# Patient Record
Sex: Female | Born: 1980 | Race: White | Hispanic: No | Marital: Married | Smoking: Former smoker
Health system: Southern US, Community
[De-identification: ages and names within clinical notes are randomized; demographics above are authoritative.]

## PROBLEM LIST (undated history)

## (undated) DIAGNOSIS — Z789 Other specified health status: Secondary | ICD-10-CM

---
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
## 2004-09-09 ENCOUNTER — Observation Stay: Payer: Self-pay

## 2004-09-28 ENCOUNTER — Ambulatory Visit: Payer: Self-pay | Admitting: Obstetrics and Gynecology

## 2004-09-29 ENCOUNTER — Ambulatory Visit: Payer: Self-pay | Admitting: Obstetrics and Gynecology

## 2004-12-18 ENCOUNTER — Observation Stay: Payer: Self-pay | Admitting: Obstetrics and Gynecology

## 2004-12-21 ENCOUNTER — Inpatient Hospital Stay: Payer: Self-pay | Admitting: Obstetrics and Gynecology

## 2005-11-02 ENCOUNTER — Emergency Department: Payer: Self-pay | Admitting: Emergency Medicine

## 2011-09-25 ENCOUNTER — Ambulatory Visit: Payer: Self-pay

## 2011-11-07 ENCOUNTER — Emergency Department: Payer: Self-pay | Admitting: Emergency Medicine

## 2012-09-22 IMAGING — NM NM THYROID IMAGING W/ UPTAKE SINGLE (24 HR)
1 series · 3 of 3 positions shown · non-contrast
Comparison: none

REASON FOR EXAM: ongoing hyperthyroidism
COMMENTS:

[Series 1000: (id) thyroid scan · 2.40mm/px · 3 of 3 slices shown]
[im 1/3  full-range]
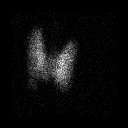
[im 2/3  full-range]
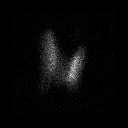
[im 3/3  full-range]
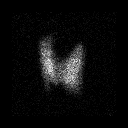

[3 of 3 positions shown; findings below may reference images not displayed]

PROCEDURE:     KNM - KNM THYROID S-S6M 24HR [DATE]  [DATE]

RESULT:     The patient has a history of hyperthyroidism. The patient
received 148.406 uCi of S-S6M orally. The scan reveals somewhat more uptake
on the left than on the right. No focal hot lesion is demonstrated however.
The 6 hour uptake is 18.9% and the 24 hour uptake is 32.1% which is near the
upper limit of normal.
IMPRESSION: 1. There is slightly increased uptake diffusely in the left thyroid lobe but
no discrete hyperfunctioning nodule is demonstrated.
2. The 24-hour uptake is near the upper limits of normal at 32.1%.

## 2014-10-05 LAB — OB RESULTS CONSOLE ANTIBODY SCREEN: ANTIBODY SCREEN: NEGATIVE

## 2014-10-05 LAB — OB RESULTS CONSOLE ABO/RH: RH Type: POSITIVE

## 2014-10-05 LAB — OB RESULTS CONSOLE RUBELLA ANTIBODY, IGM: Rubella: IMMUNE

## 2014-10-05 LAB — OB RESULTS CONSOLE HEPATITIS B SURFACE ANTIGEN: Hepatitis B Surface Ag: NEGATIVE

## 2014-10-05 LAB — OB RESULTS CONSOLE GC/CHLAMYDIA
Chlamydia: NEGATIVE
GC PROBE AMP, GENITAL: NEGATIVE

## 2014-10-05 LAB — OB RESULTS CONSOLE HIV ANTIBODY (ROUTINE TESTING): HIV: NONREACTIVE

## 2014-10-05 LAB — OB RESULTS CONSOLE RPR: RPR: NONREACTIVE

## 2014-10-05 LAB — OB RESULTS CONSOLE VARICELLA ZOSTER ANTIBODY, IGG: VARICELLA IGG: IMMUNE

## 2014-11-02 ENCOUNTER — Ambulatory Visit: Payer: Self-pay | Admitting: Obstetrics and Gynecology

## 2014-11-02 LAB — HEMOGLOBIN: HGB: 11.7 g/dL — ABNORMAL LOW (ref 12.0–16.0)

## 2014-11-02 LAB — TSH: Thyroid Stimulating Horm: <0.01 u[IU]/mL — ABNORMAL LOW

## 2014-11-02 LAB — T4, FREE: Free Thyroxine: 1.23 ng/dL (ref 0.76–1.46)

## 2014-11-04 ENCOUNTER — Ambulatory Visit: Payer: Self-pay | Admitting: Obstetrics and Gynecology

## 2014-11-04 HISTORY — PX: CERVICAL CERCLAGE: SHX1329

## 2014-11-18 NOTE — L&D Delivery Note (Signed)
Delivery Note  At 11:23 PM a viable unspecified sex was delivered via Vaginal, Spontaneous Delivery (Presentation: Left Occiput Anterior).  APGAR: 1, 5; weight 6 lb 13.4 oz (3100 g).   Placenta status: Intact, Spontaneous.  Cord: 3 vessels with the following complications: None.  Cord pH: not done  Anesthesia: Epidural  Episiotomy: Median Lacerations: None Suture Repair: 2.0 3.0 Est. Blood Loss (mL): 100 Baby with poor color and tone , promted nursury staff to use PPV+ deep suction  apgars 1/5/8   Mom to postpartum.  Baby to to special care - grunting.  SCHERMERHORN,THOMAS 04/04/2015, 11:59 PM

## 2015-02-07 ENCOUNTER — Observation Stay: Payer: Self-pay

## 2015-03-11 NOTE — Op Note (Signed)
PATIENT NAME:  Sheila Acosta, Preethi S MR#:  [id number] DATE OF BIRTH:  [birth year]  PREOPERATIVE DIAGNOSES:   1. History of cervical incompetence. 2. 15 + 5 weeks estimated gestational age.  POSTOPERATIVE DIAGNOSES:   1. History of cervical incompetence. 2. 15 + 5 weeks estimated gestational age.  PROCEDURE: McDonald cervical cerclage.   ANESTHESIA: Spinal.   SURGEON: Suzy Bouchardhomas J. Pieter Fooks, M.D.   INDICATION: This is a 34 year old G4, para 1, 0, 2, 1 with a prior fetal demise at 20 weeks after developing advanced cervical dilation. The patient's last pregnancy resulted in a term pregnancy. This pregnancy had a cerclage placed. The patient is aware of the potential risk for rupture of membranes, fetal demise due to the placement of the cerclage today.   PROCEDURE: After adequate spinal anesthesia, the patient was placed in the dorsal supine position with legs in the candy cane stirrups. The lower abdomen, perineum, and vagina were prepped with Betadine. Straight catheterization of the bladder yielded 150 mL clear urine. A #1 Prolene suture starting at 12:00 was placed through cervix in a pursestring fashion, starting at 12:00, exiting at 2:00, entering at 4:00, exiting at 6:00 entering at 6:30, exiting at 8:30, entering at 9:30, and exiting at 12:00. The knot was tied. A second #1 Prolene suture was placed more cephalad. No difficulty with placement. Good hemostasis was noted. Both knots were tied at 12:00.   ESTIMATED BLOOD LOSS: Minimal.   INTRAOPERATIVE FLUIDS: 500 mL.  The patient tolerated the procedure well and was taken to the recovery room in good condition. The patient did receive 2 grams of IV cefoxitin prior to commencement of the case.   ____________________________ Suzy Bouchardhomas J. Addalyne Vandehei, MD tjs:ap D: 11/04/2014 13:10:41 ET T: 11/04/2014 13:30:46 ET JOB#: [id number]  cc: Suzy Bouchardhomas J. Irais Mottram, MD, <Dictator> Suzy BouchardHOMAS J Renezmae Canlas MD ELECTRONICALLY SIGNED 11/07/2014 10:21

## 2015-04-02 LAB — OB RESULTS CONSOLE GBS
GBS: NEGATIVE
GBS: NEGATIVE
GBS: NEGATIVE

## 2015-04-02 LAB — OB RESULTS CONSOLE GC/CHLAMYDIA
CHLAMYDIA, DNA PROBE: NEGATIVE
Chlamydia: NEGATIVE
Chlamydia: NEGATIVE
GC PROBE AMP, GENITAL: NEGATIVE
Gonorrhea: NEGATIVE
Gonorrhea: NEGATIVE

## 2015-04-02 LAB — OB RESULTS CONSOLE RPR
RPR: NONREACTIVE
RPR: NONREACTIVE
RPR: NONREACTIVE

## 2015-04-04 ENCOUNTER — Encounter: Payer: Self-pay | Admitting: *Deleted

## 2015-04-04 ENCOUNTER — Inpatient Hospital Stay
Admission: EM | Admit: 2015-04-04 | Discharge: 2015-04-06 | DRG: 775 | Disposition: A | Discharge status: Home / Self Care | Payer: Medicaid Other | Attending: Obstetrics and Gynecology | Admitting: Obstetrics and Gynecology

## 2015-04-04 ENCOUNTER — Inpatient Hospital Stay: Payer: Medicaid Other | Admitting: Anesthesiology

## 2015-04-04 DIAGNOSIS — O3433 Maternal care for cervical incompetence, third trimester: Secondary | ICD-10-CM | POA: Diagnosis present

## 2015-04-04 DIAGNOSIS — O2653 Maternal hypotension syndrome, third trimester: Secondary | ICD-10-CM | POA: Diagnosis not present

## 2015-04-04 DIAGNOSIS — Z3A37 37 weeks gestation of pregnancy: Secondary | ICD-10-CM | POA: Diagnosis present

## 2015-04-04 DIAGNOSIS — O479 False labor, unspecified: Secondary | ICD-10-CM | POA: Diagnosis present

## 2015-04-04 DIAGNOSIS — O09293 Supervision of pregnancy with other poor reproductive or obstetric history, third trimester: Secondary | ICD-10-CM | POA: Diagnosis present

## 2015-04-04 HISTORY — DX: Other specified health status: Z78.9

## 2015-04-04 LAB — CBC
HCT: 34.8 % — ABNORMAL LOW (ref 35.0–47.0)
Hemoglobin: 11.7 g/dL — ABNORMAL LOW (ref 12.0–16.0)
MCH: 29.9 pg (ref 26.0–34.0)
MCHC: 33.7 g/dL (ref 32.0–36.0)
MCV: 88.7 fL (ref 80.0–100.0)
Platelets: 197 10*3/uL (ref 150–440)
RBC: 3.92 MIL/uL (ref 3.80–5.20)
RDW: 13.1 % (ref 11.5–14.5)
WBC: 10.6 10*3/uL (ref 3.6–11.0)

## 2015-04-04 LAB — TYPE AND SCREEN
ABO/RH(D): A POS
ANTIBODY SCREEN: NEGATIVE

## 2015-04-04 LAB — OB RESULTS CONSOLE ABO/RH

## 2015-04-04 MED ORDER — EPHEDRINE 5 MG/ML INJ
10.0000 mg | INTRAVENOUS | Status: DC | PRN
  Filled 2015-04-04: qty 2

## 2015-04-04 MED ORDER — MISOPROSTOL 200 MCG PO TABS
ORAL_TABLET | ORAL | Status: AC
  Filled 2015-04-04: qty 4

## 2015-04-04 MED ORDER — BUTORPHANOL TARTRATE 1 MG/ML IJ SOLN
1.0000 mg | INTRAMUSCULAR | Status: DC | PRN
Start: 2015-04-04 — End: 2015-04-05

## 2015-04-04 MED ORDER — ONDANSETRON HCL 4 MG/2ML IJ SOLN: 4.0000 mg | Freq: Four times a day (QID) | INTRAMUSCULAR | Status: DC | PRN

## 2015-04-04 MED ORDER — FENTANYL 2.5 MCG/ML W/ROPIVACAINE 0.2% IN NS 100 ML EPIDURAL INFUSION (ARMC-ANES)
10.0000 mL/h | EPIDURAL | Status: DC
  Administered 2015-04-04 (×2): 10 mL/h via EPIDURAL

## 2015-04-04 MED ORDER — OXYTOCIN 40 UNITS IN LACTATED RINGERS INFUSION - SIMPLE MED
62.5000 mL/h | INTRAVENOUS | Status: DC
  Administered 2015-04-04: 62.5 mL/h via INTRAVENOUS

## 2015-04-04 MED ORDER — ACETAMINOPHEN 325 MG PO TABS: 650.0000 mg | ORAL_TABLET | ORAL | Status: DC | PRN

## 2015-04-04 MED ORDER — OXYTOCIN 40 UNITS IN LACTATED RINGERS INFUSION - SIMPLE MED
1.0000 m[IU]/min | INTRAVENOUS | Status: DC
  Administered 2015-04-04: 1 m[IU]/min via INTRAVENOUS

## 2015-04-04 MED ORDER — OXYCODONE-ACETAMINOPHEN 5-325 MG PO TABS: 2.0000 | ORAL_TABLET | ORAL | Status: DC | PRN

## 2015-04-04 MED ORDER — DIPHENHYDRAMINE HCL 50 MG/ML IJ SOLN: 12.5000 mg | INTRAMUSCULAR | Status: DC | PRN

## 2015-04-04 MED ORDER — OXYTOCIN 40 UNITS IN LACTATED RINGERS INFUSION - SIMPLE MED
INTRAVENOUS | Status: AC
  Filled 2015-04-04: qty 1000

## 2015-04-04 MED ORDER — LACTATED RINGERS IV SOLN
INTRAVENOUS | Status: DC
  Administered 2015-04-04: 18:00:00 via INTRAVENOUS

## 2015-04-04 MED ORDER — TERBUTALINE SULFATE 1 MG/ML IJ SOLN: 0.2500 mg | Freq: Once | INTRAMUSCULAR | Status: AC | PRN

## 2015-04-04 MED ORDER — AMMONIA AROMATIC IN INHA
RESPIRATORY_TRACT | Status: AC
  Filled 2015-04-04: qty 10

## 2015-04-04 MED ORDER — EPHEDRINE 5 MG/ML INJ
10.0000 mg | Freq: Once | INTRAVENOUS | Status: AC
Start: 2015-04-04 — End: 2015-04-04
  Administered 2015-04-04: 10 mg via INTRAVENOUS

## 2015-04-04 MED ORDER — FENTANYL 2.5 MCG/ML W/ROPIVACAINE 0.2% IN NS 100 ML EPIDURAL INFUSION (ARMC-ANES)
EPIDURAL | Status: AC
  Administered 2015-04-04: 10 mL/h via EPIDURAL
  Filled 2015-04-04: qty 100

## 2015-04-04 MED ORDER — BUPIVACAINE HCL (PF) 0.25 % IJ SOLN
INTRAMUSCULAR | Status: AC | PRN
Start: 2015-04-04 — End: ?
  Administered 2015-04-04 (×2): 10 mL

## 2015-04-04 MED ORDER — OXYTOCIN 10 UNIT/ML IJ SOLN
INTRAMUSCULAR | Status: AC
  Filled 2015-04-04: qty 2

## 2015-04-04 MED ORDER — OXYTOCIN BOLUS FROM INFUSION
500.0000 mL | INTRAVENOUS | Status: DC
  Administered 2015-04-04: 500 mL via INTRAVENOUS

## 2015-04-04 MED ORDER — LACTATED RINGERS IV SOLN: 500.0000 mL | INTRAVENOUS | Status: DC | PRN

## 2015-04-04 MED ORDER — LIDOCAINE HCL (PF) 1 % IJ SOLN
INTRAMUSCULAR | Status: AC
Start: 2015-04-04 — End: 2015-04-05
  Filled 2015-04-04: qty 30

## 2015-04-04 MED ORDER — LIDOCAINE-EPINEPHRINE (PF) 1.5 %-1:200000 IJ SOLN
INTRAMUSCULAR | Status: AC | PRN
  Administered 2015-04-04: 4 mg
  Administered 2015-04-04: 4 mL

## 2015-04-04 MED ORDER — OXYCODONE-ACETAMINOPHEN 5-325 MG PO TABS
1.0000 | ORAL_TABLET | ORAL | Status: DC | PRN
  Administered 2015-04-05: 1 via ORAL
  Filled 2015-04-04 (×2): qty 1

## 2015-04-04 MED ORDER — PHENYLEPHRINE 40 MCG/ML (10ML) SYRINGE FOR IV PUSH (FOR BLOOD PRESSURE SUPPORT)
80.0000 ug | PREFILLED_SYRINGE | INTRAVENOUS | Status: DC | PRN
  Filled 2015-04-04: qty 2

## 2015-04-04 NOTE — Anesthesia Procedure Notes (Signed)
Epidural Patient location during procedure: OB Start time: 04/04/2015 7:50 PM End time: 04/04/2015 8:03 PM  Staffing Anesthesiologist: Yves DillARROLL, Svea Pusch  Preanesthetic Checklist Completed: patient identified, site marked, pre-op evaluation, timeout performed, IV checked, risks and benefits discussed and monitors and equipment checked  Epidural Patient position: sitting Prep: Betadine Patient monitoring: heart rate, cardiac monitor, continuous pulse ox and blood pressure Approach: midline Location: L3-L4 Injection technique: LOR air  Needle:  Needle type: Tuohy  Needle gauge: 18 G Needle length: 9 cm Needle insertion depth: 5 cm Catheter type: closed end flexible Catheter size: 20 Guage Catheter at skin depth: 9 cm Test dose: negative  Assessment Sensory level: T9  Additional Notes Under sterile prep and drape.  Neg test dose..  Easy epidural with easy thread and no paresthesiasReason for block:procedure for pain

## 2015-04-04 NOTE — OB Triage Note (Signed)
Recvd to Obs 2 c/o leaking fluid that is thinner than usual discharge.  Has history of cerclage with this pg that was removed Friday 5/13.   Camelia PhenesHall,Virgia Kelner F, RN

## 2015-04-04 NOTE — Progress Notes (Signed)
Patient ID: Sheila SartoriusRachel S Acosta, female   DOB: Aug 16, 1981, 34 y.o.   MRN: [id number] Cle in place  Transient hypotension responded to ephedrine. cx still 5 cm / 80/-1  Reassuring fetal monitoring  IUPC placed  A: inadequate ctx pattern  P: start pitocin augmentation

## 2015-04-04 NOTE — H&P (Signed)
Sheila Acosta is a 34 y.o. female presenting for labor . Hx of incompetent cx s/p cerclage with removal 5 days ago .   History OB History    Gravida Para Term Preterm AB TAB SAB Ectopic Multiple Living   4 1 1  2  1   1      Past Medical History  Diagnosis Date  . Medical history non-contributory    Past Surgical History  Procedure Laterality Date  . Cervical cerclage  11/04/2014   Family History: family history is not on file. Social History:  reports that she has quit smoking. She does not have any smokeless tobacco history on file. She reports that she does not drink alcohol or use illicit drugs.   Prenatal Transfer Tool  Maternal Diabetes: No Genetic Screening: Normal Maternal Ultrasounds/Referrals: Normal Fetal Ultrasounds or other Referrals:   Maternal Substance Abuse:  No Significant Maternal Medications:  None Significant Maternal Lab Results: low tsh , work up negative by endocrinology  Other Comments:  None  ROS H/o neonatal death secondary to severe prematurity   Dilation: 5 Effacement (%): 80 Station: -2 Exam by:: Schermerhorn  Blood pressure 112/68, pulse 90, temperature 98.5 F (36.9 C), resp. rate 16, height 5\' 7"  (1.702 m), weight 91.627 kg (202 lb).  Fetal monitoring cat 1 Exam Physical Exam  Prenatal labs: ABO, Rh: A/--/-- (05/17 0000) Antibody: Negative (11/18 0000) Rubella: Immune (11/18 0000) RPR: Nonreactive, Nonreactive, Nonreactive (05/15 0000)  HBsAg: Negative (11/18 0000)  HIV: Non-reactive (11/18 0000)  GBS: Negative, Negative, Negative (05/15 0000)   Assessment/Plan: 37+0 weeks , active labor . Arom clear  Anticipate svd  Pt request cle     SCHERMERHORN,THOMAS 04/04/2015, 7:40 PM

## 2015-04-04 NOTE — Anesthesia Preprocedure Evaluation (Signed)
Anesthesia Evaluation  Patient identified by MRN, date of birth, ID band Patient awake    Reviewed: Allergy & Precautions, NPO status , Patient's Chart, lab work & pertinent test results  Airway Mallampati: II  TM Distance: >3 FB Neck ROM: Full    Dental no notable dental hx.    Pulmonary former smoker,  breath sounds clear to auscultation  Pulmonary exam normal       Cardiovascular negative cardio ROS  Rhythm:Regular Rate:Normal     Neuro/Psych negative neurological ROS  negative psych ROS   GI/Hepatic negative GI ROS, Neg liver ROS,   Endo/Other  negative endocrine ROS  Renal/GU negative Renal ROS  negative genitourinary   Musculoskeletal negative musculoskeletal ROS (+)   Abdominal Normal abdominal exam  (+)   Peds negative pediatric ROS (+)  Hematology negative hematology ROS (+)   Anesthesia Other Findings   Reproductive/Obstetrics (+) Pregnancy                             Anesthesia Physical Anesthesia Plan  ASA: II  Anesthesia Plan: Epidural   Post-op Pain Management:    Induction:   Airway Management Planned: Natural Airway  Additional Equipment:   Intra-op Plan:   Post-operative Plan:   Informed Consent: I have reviewed the patients History and Physical, chart, labs and discussed the procedure including the risks, benefits and alternatives for the proposed anesthesia with the patient or authorized representative who has indicated his/her understanding and acceptance.     Plan Discussed with: Surgeon  Anesthesia Plan Comments:         Anesthesia Quick Evaluation

## 2015-04-05 ENCOUNTER — Encounter: Payer: Self-pay | Admitting: *Deleted

## 2015-04-05 LAB — CBC
HCT: 32.4 % — ABNORMAL LOW (ref 35.0–47.0)
Hemoglobin: 10.7 g/dL — ABNORMAL LOW (ref 12.0–16.0)
MCH: 29.3 pg (ref 26.0–34.0)
MCHC: 33.1 g/dL (ref 32.0–36.0)
MCV: 88.3 fL (ref 80.0–100.0)
PLATELETS: 206 10*3/uL (ref 150–440)
RBC: 3.66 MIL/uL — ABNORMAL LOW (ref 3.80–5.20)
RDW: 13.2 % (ref 11.5–14.5)
WBC: 15.9 10*3/uL — AB (ref 3.6–11.0)

## 2015-04-05 LAB — ABO/RH: ABO/RH(D): A POS

## 2015-04-05 LAB — RPR: RPR Ser Ql: NONREACTIVE

## 2015-04-05 MED ORDER — ONDANSETRON HCL 4 MG/2ML IJ SOLN: 4.0000 mg | INTRAMUSCULAR | Status: DC | PRN

## 2015-04-05 MED ORDER — FERROUS SULFATE 325 (65 FE) MG PO TABS
325.0000 mg | ORAL_TABLET | Freq: Two times a day (BID) | ORAL | Status: DC
  Administered 2015-04-05 – 2015-04-06 (×3): 325 mg via ORAL
  Filled 2015-04-05 (×3): qty 1

## 2015-04-05 MED ORDER — MEASLES, MUMPS & RUBELLA VAC ~~LOC~~ INJ: 0.5000 mL | INJECTION | Freq: Once | SUBCUTANEOUS | Status: DC

## 2015-04-05 MED ORDER — SIMETHICONE 80 MG PO CHEW: 80.0000 mg | CHEWABLE_TABLET | ORAL | Status: DC | PRN

## 2015-04-05 MED ORDER — BENZOCAINE-MENTHOL 20-0.5 % EX AERO
1.0000 "application " | INHALATION_SPRAY | CUTANEOUS | Status: DC | PRN
  Administered 2015-04-05: 1 via TOPICAL
  Filled 2015-04-05: qty 56

## 2015-04-05 MED ORDER — ONDANSETRON HCL 4 MG PO TABS: 4.0000 mg | ORAL_TABLET | ORAL | Status: DC | PRN

## 2015-04-05 MED ORDER — MAGNESIUM HYDROXIDE 400 MG/5ML PO SUSP: 30.0000 mL | ORAL | Status: DC | PRN

## 2015-04-05 MED ORDER — DIPHENHYDRAMINE HCL 25 MG PO CAPS: 25.0000 mg | ORAL_CAPSULE | Freq: Four times a day (QID) | ORAL | Status: DC | PRN

## 2015-04-05 MED ORDER — LANOLIN HYDROUS EX OINT: TOPICAL_OINTMENT | CUTANEOUS | Status: DC | PRN

## 2015-04-05 MED ORDER — IBUPROFEN 600 MG PO TABS
600.0000 mg | ORAL_TABLET | Freq: Four times a day (QID) | ORAL | Status: DC
  Administered 2015-04-05 – 2015-04-06 (×6): 600 mg via ORAL
  Filled 2015-04-05 (×5): qty 1

## 2015-04-05 MED ORDER — SENNOSIDES-DOCUSATE SODIUM 8.6-50 MG PO TABS
2.0000 | ORAL_TABLET | ORAL | Status: DC
  Administered 2015-04-05 – 2015-04-06 (×2): 2 via ORAL
  Filled 2015-04-05 (×2): qty 2

## 2015-04-05 MED ORDER — WITCH HAZEL-GLYCERIN EX PADS: 1.0000 "application " | MEDICATED_PAD | CUTANEOUS | Status: DC | PRN

## 2015-04-05 MED ORDER — DIBUCAINE 1 % RE OINT
1.0000 | TOPICAL_OINTMENT | RECTAL | Status: DC | PRN
Start: 2015-04-05 — End: 2015-04-06

## 2015-04-05 MED ORDER — ACETAMINOPHEN 325 MG PO TABS: 650.0000 mg | ORAL_TABLET | ORAL | Status: DC | PRN

## 2015-04-05 MED ORDER — ZOLPIDEM TARTRATE 5 MG PO TABS: 5.0000 mg | ORAL_TABLET | Freq: Every evening | ORAL | Status: DC | PRN

## 2015-04-05 MED ORDER — PRENATAL MULTIVITAMIN CH
1.0000 | ORAL_TABLET | Freq: Every day | ORAL | Status: DC
  Administered 2015-04-05 – 2015-04-06 (×2): 1 via ORAL
  Filled 2015-04-05 (×2): qty 1

## 2015-04-05 NOTE — Care Management (Signed)
RNCM consult received for Medicaid. Message left at patient's home after attempting to reach patient in her hospital room. Patient has Medicaid listed as being a payer on this admission. RNCM to continue to follow.

## 2015-04-05 NOTE — Progress Notes (Signed)
Post Partum Day 1 Subjective: no complaints  Objective: Blood pressure 117/62, pulse 100, temperature 98.4 F (36.9 C), temperature source Oral, resp. rate 18, height 5\' 7"  (1.702 m), weight 91.627 kg (202 lb), SpO2 97 %, unknown if currently breastfeeding.  Physical Exam:  General: alert and cooperative Lochia: appropriate Uterine Fundus: firm Incision: DVT Evaluation: No evidence of DVT seen on physical exam.   Recent Labs  04/04/15 1829 04/05/15 0516  HGB 11.7* 10.7*  HCT 34.8* 32.4*    Assessment/Plan: Plan for discharge tomorrow   LOS: 1 day   SCHERMERHORN,THOMAS 04/05/2015, 1:53 PM

## 2015-04-06 MED ORDER — IBUPROFEN 800 MG PO TABS: 800.0000 mg | ORAL_TABLET | Freq: Three times a day (TID) | ORAL | Status: AC | PRN

## 2015-04-06 NOTE — Discharge Instructions (Signed)
Postpartum Care After Vaginal Delivery °After you deliver your newborn (postpartum period), the usual stay in the hospital is 24-72 hours. If there were problems with your labor or delivery, or if you have other medical problems, you might be in the hospital longer.  °While you are in the hospital, you will receive help and instructions on how to care for yourself and your newborn during the postpartum period.  °While you are in the hospital: °· Be sure to tell your nurses if you have pain or discomfort, as well as where you feel the pain and what makes the pain worse. °· If you had an incision made near your vagina (episiotomy) or if you had some tearing during delivery, the nurses may put ice packs on your episiotomy or tear. The ice packs may help to reduce the pain and swelling. °· If you are breastfeeding, you may feel uncomfortable contractions of your uterus for a couple of weeks. This is normal. The contractions help your uterus get back to normal size. °· It is normal to have some bleeding after delivery. °¨ For the first 1-3 days after delivery, the flow is red and the amount may be similar to a period. °¨ It is common for the flow to start and stop. °¨ In the first few days, you may pass some small clots. Let your nurses know if you begin to pass large clots or your flow increases. °¨ Do not  flush blood clots down the toilet before having the nurse look at them. °¨ During the next 3-10 days after delivery, your flow should become more watery and pink or brown-tinged in color. °¨ Ten to fourteen days after delivery, your flow should be a small amount of yellowish-white discharge. °¨ The amount of your flow will decrease over the first few weeks after delivery. Your flow may stop in 6-8 weeks. Most women have had their flow stop by 12 weeks after delivery. °· You should change your sanitary pads frequently. °· Wash your hands thoroughly with soap and water for at least 20 seconds after changing pads, using  the toilet, or before holding or feeding your newborn. °· You should feel like you need to empty your bladder within the first 6-8 hours after delivery. °· In case you become weak, lightheaded, or faint, call your nurse before you get out of bed for the first time and before you take a shower for the first time. °· Within the first few days after delivery, your breasts may begin to feel tender and full. This is called engorgement. Breast tenderness usually goes away within 48-72 hours after engorgement occurs. You may also notice milk leaking from your breasts. If you are not breastfeeding, do not stimulate your breasts. Breast stimulation can make your breasts produce more milk. °· Spending as much time as possible with your newborn is very important. During this time, you and your newborn can feel close and get to know each other. Having your newborn stay in your room (rooming in) will help to strengthen the bond with your newborn.  It will give you time to get to know your newborn and become comfortable caring for your newborn. °· Your hormones change after delivery. Sometimes the hormone changes can temporarily cause you to feel sad or tearful. These feelings should not last more than a few days. If these feelings last longer than that, you should talk to your caregiver. °· If desired, talk to your caregiver about methods of family planning or contraception. °·   Talk to your caregiver about immunizations. Your caregiver may want you to have the following immunizations before leaving the hospital:  Tetanus, diphtheria, and pertussis (Tdap) or tetanus and diphtheria (Td) immunization. It is very important that you and your family (including grandparents) or others caring for your newborn are up-to-date with the Tdap or Td immunizations. The Tdap or Td immunization can help protect your newborn from getting ill.  Rubella immunization.  Varicella (chickenpox) immunization.  Influenza immunization. You should  receive this annual immunization if you did not receive the immunization during your pregnancy. Document Released: 09/01/2007 Document Revised: 07/29/2012 Document Reviewed: 07/01/2012 [facility] Patient Information 2015 [employer]. This information is not intended to replace advice given to you by your health care provider. Make sure you discuss any questions you have with your health care provider. Levonorgestrel intrauterine device (IUD) What is this medicine? LEVONORGESTREL IUD (LEE voe nor jes trel) is a contraceptive (birth control) device. The device is placed inside the uterus by a healthcare professional. It is used to prevent pregnancy and can also be used to treat heavy bleeding that occurs during your period. Depending on the device, it can be used for 3 to 5 years. This medicine may be used for other purposes; ask your health care provider or pharmacist if you have questions. COMMON BRAND NAME(S): Elveria RoyalsLILETTA, Mirena, Skyla What should I tell my health care provider before I take this medicine? They need to know if you have any of these conditions: -abnormal Pap smear -cancer of the breast, uterus, or cervix -diabetes -endometritis -genital or pelvic infection now or in the past -have more than one sexual partner or your partner has more than one partner -heart disease -history of an ectopic or tubal pregnancy -immune system problems -IUD in place -liver disease or tumor -problems with blood clots or take blood-thinners -use intravenous drugs -uterus of unusual shape -vaginal bleeding that has not been explained -an unusual or allergic reaction to levonorgestrel, other hormones, silicone, or polyethylene, medicines, foods, dyes, or preservatives -pregnant or trying to get pregnant -breast-feeding How should I use this medicine? This device is placed inside the uterus by a health care professional. Talk to your pediatrician regarding the use of this medicine in children.  Special care may be needed. Overdosage: If you think you have taken too much of this medicine contact a poison control center or emergency room at once. NOTE: This medicine is only for you. Do not share this medicine with others. What if I miss a dose? This does not apply. What may interact with this medicine? Do not take this medicine with any of the following medications: -amprenavir -bosentan -fosamprenavir This medicine may also interact with the following medications: -aprepitant -barbiturate medicines for inducing sleep or treating seizures -bexarotene -griseofulvin -medicines to treat seizures like carbamazepine, ethotoin, felbamate, oxcarbazepine, phenytoin, topiramate -modafinil -pioglitazone -rifabutin -rifampin -rifapentine -some medicines to treat HIV infection like atazanavir, indinavir, lopinavir, nelfinavir, tipranavir, ritonavir -St. John's wort -warfarin This list may not describe all possible interactions. Give your health care provider a list of all the medicines, herbs, non-prescription drugs, or dietary supplements you use. Also tell them if you smoke, drink alcohol, or use illegal drugs. Some items may interact with your medicine. What should I watch for while using this medicine? Visit your doctor or health care professional for regular check ups. See your doctor if you or your partner has sexual contact with others, becomes HIV positive, or gets a sexual transmitted disease. This product does  not protect you against HIV infection (AIDS) or other sexually transmitted diseases. You can check the placement of the IUD yourself by reaching up to the top of your vagina with clean fingers to feel the threads. Do not pull on the threads. It is a good habit to check placement after each menstrual period. Call your doctor right away if you feel more of the IUD than just the threads or if you cannot feel the threads at all. The IUD may come out by itself. You may become  pregnant if the device comes out. If you notice that the IUD has come out use a backup birth control method like condoms and call your health care provider. Using tampons will not change the position of the IUD and are okay to use during your period. What side effects may I notice from receiving this medicine? Side effects that you should report to your doctor or health care professional as soon as possible: -allergic reactions like skin rash, itching or hives, swelling of the face, lips, or tongue -fever, flu-like symptoms -genital sores -high blood pressure -no menstrual period for 6 weeks during use -pain, swelling, warmth in the leg -pelvic pain or tenderness -severe or sudden headache -signs of pregnancy -stomach cramping -sudden shortness of breath -trouble with balance, talking, or walking -unusual vaginal bleeding, discharge -yellowing of the eyes or skin Side effects that usually do not require medical attention (report to your doctor or health care professional if they continue or are bothersome): -acne -breast pain -change in sex drive or performance -changes in weight -cramping, dizziness, or faintness while the device is being inserted -headache -irregular menstrual bleeding within first 3 to 6 months of use -nausea This list may not describe all possible side effects. Call your doctor for medical advice about side effects. You may report side effects to FDA at 1-800-FDA-1088. Where should I keep my medicine? This does not apply. NOTE: This sheet is a summary. It may not cover all possible information. If you have questions about this medicine, talk to your doctor, pharmacist, or health care provider.  2015, Elsevier/Gold Standard. (2011-12-05 13:54:04)

## 2015-04-06 NOTE — Anesthesia Postprocedure Evaluation (Cosign Needed Addendum)
  Anesthesia Post-op Note  Patient: Sheila SartoriusRachel S Horan  Procedure(s) Performed: Labor epidural Anesthesia type:Epidural  Patient location: Mother baby  Post pain: Pain level controlled  Post assessment: Post-op Vital signs reviewed, Patient's Cardiovascular Status Stable, Respiratory Function Stable, Patent Airway and No signs of Nausea or vomiting  Post vital signs: Reviewed and stable  Last Vitals:  Filed Vitals:   04/05/15 1900  BP: 103/64  Pulse: 75  Temp: 36.7 C  Resp: 18    Level of consciousness: awake, alert  and patient cooperative  Complications: No apparent anesthesia complications

## 2015-04-06 NOTE — Discharge Summary (Signed)
Obstetric Discharge Summary Reason for Admission: onset of labor Prenatal Procedures: cerclage Intrapartum Procedures: spontaneous vaginal delivery Postpartum Procedures: None Complications-Operative and Postpartum: 2nd degree perineal laceration HEMOGLOBIN  Date Value Ref Range Status  04/05/2015 10.7* 12.0 - 16.0 g/dL Final   HGB  Date Value Ref Range Status  11/02/2014 11.7* 12.0-16.0 g/dL Final   HCT  Date Value Ref Range Status  04/05/2015 32.4* 35.0 - 47.0 % Final    Physical Exam:  General: alert, cooperative, appears stated age and no distress Lochia: appropriate Uterine Fundus: firm, non-tender Incision: n/a DVT Evaluation: No evidence of DVT seen on physical exam.  Discharge Diagnoses: Term Pregnancy-delivered  Discharge Information: Date: 04/06/2015 Activity: pelvic rest Diet: routine Medications: Ibuprofen Condition: stable Instructions: refer to practice specific booklet Discharge to: home Follow-up Information    Follow up with SCHERMERHORN,THOMAS, MD In 6 weeks.   Specialty:  Obstetrics and Gynecology   Contact information:   [address] [place] [id number] [phone]       Newborn Data: Live born female  Birth Weight: 6 lb 13.3 oz (3099 g) APGAR: 1, 5, 8 PPV applied in delivery room.   Home with mother eventually, currently admitted for respiratory distress of unknown etiology.Derrill Kay.  Darci Lykins MICHAEL 04/06/2015, 6:39 AM

## 2015-04-09 ENCOUNTER — Ambulatory Visit: Payer: Self-pay

## 2015-04-09 NOTE — Lactation Note (Signed)
This note was copied from the chart of Boy Zenovia JordanRachel Chinn. Lactation Consultation Note  Patient Name: Boy Zenovia JordanRachel Clonch Today's Date: 04/09/2015     Maternal Data    Feeding Feeding Type: Bottle Fed - Breast Milk Nipple Type: Slow - flow  LATCH Score/Interventions                      Lactation Tools Discussed/Used     Consult Status   breastfed for first time, latched well after few tries and nursed 15 min. On left breast and 20 min on right, took in 28 cc per pre/post wt, continued to feed after this wt as still fussy after wt check but this was not measured, mom's breasts were full prior to feed and softened during and after.  Dyann KiefMarsha D Emmauel Hallums 04/09/2015, 3:49 PM

## 2015-04-11 ENCOUNTER — Ambulatory Visit: Payer: Self-pay

## 2015-04-11 NOTE — Lactation Note (Signed)
This note was copied from the chart of Sheila Zenovia JordanRachel Cimo. Lactation Consultation Note  Patient Name: Sheila Acosta ZOXWR'UToday's Date: 04/11/2015 Reason for consult: Follow-up assessment;Other (Comment)   Maternal Data    Feeding Feeding Type: Breast Fed Length of feed: 25 min                                  Encouraged mom to nurse on demand instead of pacifier between fixed feeding times; Nurse to soften at least first breast well, offer second breast to baby is full; Keep I&O (see page 21 in BF book for signs of adequate nutrition) Pump as last resort for any poor feeds and then bottle feed if needed til F/U consult Friday  [facility] Latch: Grasps breast easily, tongue down, lips flanged, rhythmical sucking. Intervention(s): Skin to skin  Audible Swallowing: Spontaneous and intermittent  Type of Nipple: Everted at rest and after stimulation  Comfort (Breast/Nipple): Soft / non-tender  Interventions (Filling): Massage  Hold (Positioning): No assistance needed to correctly position infant at breast.  LATCH Score: 10  Lactation Tools Discussed/Used     Consult Status Consult Status: Follow-up Date: 04/14/15 Follow-up type: Out-patient    Sunday CornSandra Clark Forrester Blando 04/11/2015, 11:10 AM
